# Patient Record
Sex: Female | Born: 1970 | Race: White | Hispanic: No | Marital: Married | Smoking: Current every day smoker
Health system: Southern US, Community
[De-identification: ages and names within clinical notes are randomized; demographics above are authoritative.]

## PROBLEM LIST (undated history)

## (undated) DIAGNOSIS — R079 Chest pain, unspecified: Secondary | ICD-10-CM

## (undated) HISTORY — PX: BLADDER SUSPENSION: SHX72

## (undated) HISTORY — DX: Chest pain, unspecified: R07.9

## (undated) HISTORY — PX: ABDOMINAL HYSTERECTOMY: SHX81

---

## 1999-01-23 ENCOUNTER — Inpatient Hospital Stay (HOSPITAL_COMMUNITY): Admission: AD | Admit: 1999-01-23 | Discharge: 1999-01-23 | Payer: Self-pay | Admitting: Obstetrics & Gynecology

## 1999-01-28 ENCOUNTER — Inpatient Hospital Stay (HOSPITAL_COMMUNITY): Admission: AD | Admit: 1999-01-28 | Discharge: 1999-01-31 | Payer: Self-pay | Admitting: Obstetrics & Gynecology

## 1999-01-28 ENCOUNTER — Encounter (INDEPENDENT_AMBULATORY_CARE_PROVIDER_SITE_OTHER): Payer: Self-pay | Admitting: Specialist

## 1999-01-28 ENCOUNTER — Inpatient Hospital Stay (HOSPITAL_COMMUNITY): Admission: AD | Admit: 1999-01-28 | Discharge: 1999-01-28 | Payer: Self-pay | Admitting: Obstetrics and Gynecology

## 2000-03-19 ENCOUNTER — Other Ambulatory Visit: Admission: RE | Admit: 2000-03-19 | Discharge: 2000-03-19 | Payer: Self-pay | Admitting: Obstetrics & Gynecology

## 2000-10-05 ENCOUNTER — Inpatient Hospital Stay (HOSPITAL_COMMUNITY): Admission: AD | Admit: 2000-10-05 | Discharge: 2000-10-05 | Payer: Self-pay | Admitting: Obstetrics and Gynecology

## 2000-10-23 ENCOUNTER — Encounter: Payer: Self-pay | Admitting: Obstetrics and Gynecology

## 2000-10-23 ENCOUNTER — Ambulatory Visit (HOSPITAL_COMMUNITY): Admission: RE | Admit: 2000-10-23 | Discharge: 2000-10-23 | Payer: Self-pay | Admitting: Obstetrics and Gynecology

## 2000-10-29 ENCOUNTER — Inpatient Hospital Stay (HOSPITAL_COMMUNITY): Admission: AD | Admit: 2000-10-29 | Discharge: 2000-11-02 | Payer: Self-pay | Admitting: Obstetrics & Gynecology

## 2001-04-06 ENCOUNTER — Other Ambulatory Visit: Admission: RE | Admit: 2001-04-06 | Discharge: 2001-04-06 | Payer: Self-pay | Admitting: Obstetrics & Gynecology

## 2002-04-13 ENCOUNTER — Other Ambulatory Visit: Admission: RE | Admit: 2002-04-13 | Discharge: 2002-04-13 | Payer: Self-pay | Admitting: Obstetrics & Gynecology

## 2003-04-18 ENCOUNTER — Other Ambulatory Visit: Admission: RE | Admit: 2003-04-18 | Discharge: 2003-04-18 | Payer: Self-pay | Admitting: Obstetrics & Gynecology

## 2004-05-15 ENCOUNTER — Emergency Department (HOSPITAL_COMMUNITY): Admission: EM | Admit: 2004-05-15 | Discharge: 2004-05-15 | Payer: Self-pay | Admitting: Family Medicine

## 2004-05-23 ENCOUNTER — Other Ambulatory Visit: Admission: RE | Admit: 2004-05-23 | Discharge: 2004-05-23 | Payer: Self-pay | Admitting: Obstetrics & Gynecology

## 2005-07-02 ENCOUNTER — Other Ambulatory Visit: Admission: RE | Admit: 2005-07-02 | Discharge: 2005-07-02 | Payer: Self-pay | Admitting: Obstetrics & Gynecology

## 2007-10-27 ENCOUNTER — Inpatient Hospital Stay (HOSPITAL_COMMUNITY): Admission: RE | Admit: 2007-10-27 | Discharge: 2007-10-29 | Payer: Self-pay | Admitting: Obstetrics & Gynecology

## 2007-10-27 ENCOUNTER — Encounter (INDEPENDENT_AMBULATORY_CARE_PROVIDER_SITE_OTHER): Payer: Self-pay | Admitting: Obstetrics and Gynecology

## 2009-01-15 ENCOUNTER — Encounter: Admission: RE | Admit: 2009-01-15 | Discharge: 2009-01-15 | Payer: Self-pay | Admitting: Obstetrics & Gynecology

## 2010-01-15 ENCOUNTER — Encounter: Admission: RE | Admit: 2010-01-15 | Discharge: 2010-01-15 | Payer: Self-pay | Admitting: Obstetrics & Gynecology

## 2010-10-22 NOTE — Op Note (Signed)
NAME:  Michelle Roberson, Michelle Roberson NO.:  [id number]   MEDICAL RECORD NO.:  [id number]          PATIENT TYPE:  INP   LOCATION:  9304                          FACILITY:  [facility]   PHYSICIAN:  Randye Lobo, M.D.   DATE OF BIRTH:  May 08, 1971   DATE OF PROCEDURE:  10/27/2007  DATE OF DISCHARGE:                               OPERATIVE REPORT   PREOPERATIVE DIAGNOSES:  1. Menorrhagia.  2. Genuine stress incontinence.  3. Incomplete uterovaginal prolapse.   POSTOPERATIVE DIAGNOSIS:  1. Menorrhagia.  2. Genuine stress incontinence.  3. Incomplete uterovaginal prolapse.   PROCEDURES:  Laparoscopically assisted vaginal hysterectomy, Monarch  transobturator sling, cystoscopy, and posterior colporrhaphy.   SURGEON:  Randye Lobo, MD   ASSISTANT:  Gerrit Friends. Aldona Bar, MD   ANESTHESIA:  General endotracheal, local with 0.5% lidocaine with  1:200,000 of epinephrine and 0.25% Marcaine.   IV FLUIDS:  3000 mL of Ringer's lactate.   ESTIMATED BLOOD LOSS:  250 mL   URINE OUTPUT:  Quantity sufficient.   COMPLICATIONS:  None.   INDICATIONS FOR PROCEDURE:  The patient is a 40 year old para 2  Caucasian female, who presented to her primary gynecologist, Dr. Annamaria Helling, with a report of urinary incontinence of several years' duration.  The patient was experiencing leakage of urine with stressful maneuvers  such as coughing and sneezing.  The patient also reported vaginal  splinting to have bowel movements.  She was also experiencing heavy  menses.  She declines future childbearing and wished to proceed with  surgical evaluation and treatment.  The patient, therefore, underwent  multichannel urodynamic testing in the office under my direction, and  this confirmed the presence of genuine stress incontinence with a leak  point pressure of 109 cm of water.  The patient declines medical therapy  for her menorrhagia.  On pelvic exam, the patient was noted to have a  first-degree cystocele, a  first-degree rectocele, and minimal uterine  prolapse.  A plan is now made to proceed with a laparoscopically  assisted vaginal hysterectomy, transobturator sling, cystoscopy, and  anterior and posterior colporrhaphy.  Risks, benefits, and alternatives  have been discussed, and the patient wishes to proceed.   FINDINGS:  Exam under anesthesia revealed a first-degree urethrocele.  There was a first-degree mid rectocele.  The uterus demonstrated minimal  prolapse.  The uterus was noted to be approximately 6- to 7-week size.  No adnexal masses were appreciated.   Laparoscopy demonstrated a normal uterus, tubes, and ovaries.  In the  right upper quadrant, there were adhesions between the omentum and the  anterior abdominal wall.  The liver and gallbladder appeared to be  unremarkable.  The stomach appeared to be dilated with fluid or gas.  There was no evidence of any adhesive disease in the pelvis, and there  was no evidence of any endometriosis.  Cystoscopy demonstrated a normal  bladder throughout 360 degrees including the bladder dome and trigone.  The ureters were noted to be patent bilaterally.  There was no evidence  of a foreign body in the bladder or the urethra.  SPECIMENS:  The uterus and cervix were sent to pathology.   PROCEDURE:  The patient was reidentified in the preoperative hold area.  She did receive Ancef 1 g IV for antibiotic prophylaxis.  The patient  received PAS stockings for DVT prophylaxis.   In the operating room, general endotracheal anesthesia was induced, and  the patient was then placed in the dorsal lithotomy position.  The  abdomen and vagina were then sterilely prepped and draped.  A Foley  catheter was placed inside the bladder.  A speculum was placed inside  the vagina and a single-tooth tenaculum was initially placed on the  anterior cervical lip.  This was replaced with a Hulka tenaculum, and  the remaining speculum was then removed.   Attention  was turned to the abdomen were 1-cm umbilical incision was  created sharply with a scalpel.  A 10-mm laparoscopic trocar was  inserted into the peritoneal cavity without difficulty.  The laparoscope  confirmed proper placement.  A pneumoperitoneum was achieved, and the  patient was then placed in the Trendelenburg position.  Two 5-mm  incisions were then created in the right and left lower quadrants, and 5-  mm trocars were placed under direct visualization of the laparoscope.  An inspection of the pelvic and abdominal organs was performed.  Please  refer to the findings above.   The Gyrus instrument was used to perform the laparoscopically assisted  vaginal hysterectomy.  The left fallopian tube was grasped and was  cauterized and cut with the Gyrus instrument.  The dissection was then  carried into the posterior leaf of the broad ligament using the same  instrument to cauterize and cut.  The left round ligament was then  cauterized with the Gyrus instrument and sharply divided with the same.  Finally, the left utero-ovarian ligament was grasped with the Gyrus  instrument, and again cauterized and cut.  The dissection was then  carried into the anterior leaf of the broad ligament, and the bladder  was sharply dissected off the lower uterine segment on the left-hand  side.  A branch of the uterine artery was then cauterized with a Gyrus  instrument and sharply divided.  This concluded the dissection on the  patient's left-hand side.  The same procedure that was performed on the  left-hand side was then repeated on the right-hand side using the Gyrus  instrument.  A very little of the bladder was dissected on the patient's  right hand-side, and the uterine artery was therefore not cauterized  laparoscopically on the right-hand side.  Hemostasis was noted to be  good at this time, and the procedure then was concluded vaginally.   The patient was then placed in the high lithotomy position.   The Hulka  tenaculum was taken off the anterior cervical lip and this was replaced  with a single-tooth tenaculum on the anterior cervical lip and a curved  Jacobs tenaculum on the posterior cervical lip.  The cervix was  circumscribed with a scalpel.  The posterior cul-de-sac was entered  sharply with a Mayo scissors, and digital exam confirmed proper entry  into this location.  A long weighted speculum was then placed in the  posterior cul-de-sac.  Each of the uterosacral ligaments was clamped,  sharply divided, and suture ligated with 0 Vicryl bilaterally.  There  was some bleeding of the peritoneum near the uterosacral ligament on the  patient's right-hand side, and this responded well to monopolar cautery.  The dissection then continued down  to the inferior aspects of the  cardinal ligaments, which were each clamped, sharply divided, and suture  ligated with 0 Vicryl suture.  The bladder was sharply dissected off the  lower uterine segment.  There was not very much descensus of the uterus,  and the anterior cul-de-sac could not be entered initially.  Again, the  remaining aspects of the cardinal ligaments were clamped bilaterally,  sharply divided, and suture ligated with 0 Vicryl.  At this time, the  anterior cul-de-sac could be entered sharply with the Metzenbaum  scissors.  Digital exam confirmed proper entry into this location.  One  final pedicle was clamped on the patient's left-hand side at the top of  the cardinal ligament.  This was sharply divided and suture ligated with  0 Vicryl.  The specimen was noted to be free on the patient's left-hand  side at this time.  The same was performed on the patient's right-hand  side, and the uterine specimen was then removed and sent to pathology.   The pedicles were reexamined.  There was some bleeding noted bilaterally  at the level of the uterosacral ligaments and just above and a series of  figure-of-eight sutures of 0 and 2-0 Vicryl  were therefore placed in  these regions to create good hemostasis.   The posterior vaginal cuff was sutured with a running locked suture of 0  Vicryl at this time.  A McCall culdoplasty suture was performed next  with 0 Vicryl suture.  The suture was brought through the vagina and  into the cul-de-sac at the 6 o'clock position, down through the distal  left uterosacral ligament, across the posterior cul-de-sac in a  pursestring fashion, down through the distal right uterosacral ligament  and then out through the cul-de-sac and into the vagina at the 6 o'clock  position.  The suture was held until the end of the case at which time  it was tied, and there was excellent support of the vaginal cuff.   The remaining pedicles were reexamined and hemostasis was noted to be  acceptable at this time.  The vaginal cuff was closed vertically with a  running locked suture of 0 Vicryl.   The anterior vaginal wall was noted to have really only a small  urethrocele with no evidence of a cystocele.  Allis clamps were used to  mark a 4-cm segment of the anterior vaginal wall in the midline.  The  mucosa was injected locally with 0.5% lidocaine with 1:200,000 of  epinephrine.  The vaginal mucosa was incised vertically with a scalpel.  The vaginal mucosa was dissected off the overlying bladder using a  combination of sharp and blunt dissection.  Hemostasis was created with  both monopolar cautery and figure-of-eight sutures of 2-0 Vicryl due to  some bleeding veins over the bladder.  This controlled the bleeding  adequately.   The dissection was carried back to the level of the pubic rami  bilaterally.  The transobturator sling was performed next.  The 1-cm  incisions were created in the crural folds below the level of the  adductor longus tendons and at the level just below the clitoris  bilaterally.  The transobturator needle passer was placed first through  the patient's left crural incision,  through the obturator membrane and  muscle, and into the endopelvic fascia on the ipsilateral side.  The  same procedure that was performed on the left-hand side was then  repeated on the right-hand side.   A Foley  catheter was removed at this time, and cystoscopy was performed,  and the findings were as noted above.  The Foley catheter was then  replaced to drain all the cystoscopy fluid and the sling was attached to  the needles and brought out through the thigh incisions.  A Kelly clamp  was placed between the sling and the urethra and the plastic sheaths  were removed.  The sling was noted to be in excellent position.  The  excess sling was trimmed at the level of the thigh incisions.   There was some bleeding noted from the vaginal exit site of the sling on  the patient's left-hand side.  This responded to 2 figure-of-eight  sutures of 2-0 Vicryl suture followed by compression and then Gelfoam.  Gelfoam was similarly placed in the exit site of the sling on the  patient's right-hand side in the vagina.  Excess vaginal mucosa was  trimmed, and then the anterior vaginal wall was closed with a running  lock suture of 2-0 Vicryl.   The posterior colporrhaphy was performed last.  Allis clamps were used  to mark the midline of the posterior vaginal mucosa.  The vaginal mucosa  was injected locally with 0.5% lidocaine with 1:200,000 of epinephrine.  A triangular wedge of epithelium was excised from the perineal body and  the vaginal mucosa was incised vertically in the midline with a  Metzenbaum scissors.  The perirectal fascia was dissected off the  vaginal mucosa bilaterally.  Hemostasis during the dissection was  created with both monopolar cautery and figure-of-eight sutures of 2-0  Vicryl.  At the top of the rectocele, a pursestring suture of 2-0 Vicryl  was placed.  The remainder of the rectocele repair was performed with  vertical mattress sutures of 0 Vicryl.  Excess vaginal  mucosa was  trimmed, and the posterior vaginal wall was then closed with a running  locked suture of 2-0 Vicryl, which was used to close the perineal body  in a subcuticular fashion as per an episiotomy repair.   The McCall culdoplasty suture was tied at this time.  There was good  hemostasis and excellent elevation and support of the vaginal cuff and  anterior posterior vaginal walls.   A rectal exam was performed, and there were no sutures in the rectum.  A  gauze packing with Estrace cream was placed in the vagina.  The crural  thigh incisions were then closed with sutures of 3-0 Vicryl.  Dermabond  was placed over these.   Final laparoscopy was performed after a CO2 pneumoperitoneum was  achieved.  The pelvis was irrigated and suctioned, and there was some  bleeding noted underneath the bladder region anterior to the vaginal  cuff, and this responded to bipolar cautery with the Gyrus instrument.   Hemostasis was excellent at this time, and the procedure was therefore  concluded.  The pneumoperitoneum was released after the 5-mm trocars  were removed.  The umbilical trocar and the laparoscope were removed  simultaneously.   The skin incisions were closed with subcuticular sutures of 3-0 or 4-0  Vicryl.  The skin was injected locally with 0.25% Marcaine plain.  Dermabond was placed over the incisions.   This concluded the patient's procedure.  There were no complications.  All needle, instrument, and sponge counts were correct.  The the patient  was escorted to recovery room in stable and awake condition.      Randye Lobo, M.D.  Electronically Signed  BES/MEDQ  D:  10/27/2007  T:  10/28/2007  Job:  [id number]

## 2010-10-25 NOTE — Discharge Summary (Signed)
NAME:  Michelle Roberson, Michelle Roberson NO.:  [id number]   MEDICAL RECORD NO.:  [id number]          PATIENT TYPE:  INP   LOCATION:  9304                          FACILITY:  [facility]   PHYSICIAN:  Michelle Roberson, M.D.   DATE OF BIRTH:  1971/02/15   DATE OF ADMISSION:  10/27/2007  DATE OF DISCHARGE:  10/29/2007                               DISCHARGE SUMMARY   ADMISSION DIAGNOSES:  1. Menorrhagia.  2. Genuine stress incontinence.  3. Incomplete uterovaginal prolapse.   DISCHARGE DIAGNOSES:  1. Menorrhagia.  2. Genuine stress incontinence.  3. Incomplete uterovaginal prolapse.   SIGNIFICANT OPERATIONS AND PROCEDURES:  The patient underwent a  laparoscopically-assisted vaginal hysterectomy with Monarc  transobturator sling, cystoscopy, and posterior colporrhaphy on Oct 27, 2007, at the [facility] of [place] under the direction of Dr.  Conley Simmonds and with the assistance of Dr. Annamaria Helling.   ADMISSION HISTORY AND PHYSICAL EXAMINATION:  The patient is a 37-year-  old para 2 Caucasian female referred by her primary gynecologist, Dr.  Annamaria Helling for urinary incontinence, heavy menses, and the need for  vaginal splinting to have bowel movements.  The patient completed her  childbearing and desired for surgical evaluation and treatment.   PAST MEDICAL HISTORY:  Unremarkable.   On physical exam, the patient was noted to have a first-degree  cystocele, first-degree rectocele, and minimal uterine prolapse.   Preoperative multichannel urodynamic testing confirmed the presence of  genuine stress incontinence with a leak point pressure of 109 cmH2O.   HOSPITAL COURSE:  The patient was admitted on Oct 27, 2007, at which  time she underwent a laparoscopically-assisted vaginal hysterectomy,  Monarc transobturator sling, cystoscopy, and posterior colporrhaphy  under the direction of Dr. Conley Simmonds with the assistance of Dr. Annamaria Helling.  The estimated blood loss from surgery was  250 mL, and there were  no complications.   The findings at surgery documented a first-degree urethrocele, a first-  degree mid rectocele, and the uterus with minimal prolapse.  Laparoscopy  demonstrated a normal uterus, tubes, and ovaries.  In the right upper  quadrant, there were adhesions between the omentum and the anterior  abdominal wall.  The liver and gallbladder were unremarkable.  The  stomach appeared to be dilated with either fluid or gas.  There was no  adhesive disease in the pelvis nor sign of endometriosis.  The  cystoscopy demonstrated a normal bladder without evidence of foreign  body, and the ureters were patent.   Postoperatively, the patient had a benign surgical recovery.  The  patient did require heavy doses of morphine from her PCA, and she was  treated with Toradol.  The patient was hemodynamically stable and  demonstrated no sign of an acute abdomen.  She had minimal vaginal  bleeding.  She did receive a vaginal packing and a Foley catheter  overnight, and she received had PAS stockings and TED hose for DVT  prophylaxis.   On postoperative day #1, the patient's hemoglobin measured 10.9.  Her  basic metabolic profile was unremarkable.  The patient had her vaginal  packing removed, and she had mild-to-moderate bloody drainage.  She was  making excellent urine.   The patient, on postoperative day #1, did report some left anterior  thigh numbness that had improved since the night before.  She was  ambulating well and she was doing yoga in her hospital room.   The patient's Foley catheter was removed on postop day #1, and she began  with her bladder trials.  These were successful with some minimal  residuals.   By postoperative day #2, the patient was ambulating well and tolerating  a regular diet, and she had good control of her pain with oral pain  medication.  She was found to be ready for discharge.   The patient's incisions were clean, dry, and intact  in both the  abdominal and the crural folds of the thighs.  Her final pathology  report is pending at the time of her discharge.   DISCHARGE INSTRUCTIONS:  1. Discharged to home.  2. The patient will take the following medications:      a.     Percocet 5 mg/325 mg one to two p.o. q.4-6 h. p.r.n. pain.      b.     Ibuprofen 600 mg p.o. q.6 h p.r.n. pain.      c.     Colace 100 mg p.o. daily to b.i.d. p.r.n. constipation.      d.     Ciprofloxacin 250 mg p.o. b.i.d. x5 days.   ACTIVITY:  The patient will have decreased activity at home.   DIET:  The patient will follow a regular diet.   FOLLOWUP:  The patient will follow up in the office in 10 days' time.  She will call sooner if she experiences any problems with fever, nausea,  and vomiting; pain uncontrolled by her medication; heavy vaginal  bleeding; difficulty voiding; or any other concern.      Michelle Roberson, M.D.  Electronically Signed     BES/MEDQ  D:  12/07/2007  T:  12/08/2007  Job:  [id number]

## 2011-03-05 LAB — CBC
HCT: 31.2 — ABNORMAL LOW
HCT: 39
Hemoglobin: 10.9 — ABNORMAL LOW
Hemoglobin: 13.4
MCHC: 34.5
MCHC: 34.8
MCV: 93.4
MCV: 94.1
Platelets: 176
Platelets: 200
RBC: 3.32 — ABNORMAL LOW
RBC: 4.18
RDW: 12.1
RDW: 12.3
WBC: 11.1 — ABNORMAL HIGH
WBC: 6.4

## 2011-03-05 LAB — BASIC METABOLIC PANEL
Calcium: 8.7
GFR calc Af Amer: >60
GFR calc non Af Amer: >60
Sodium: 134 — ABNORMAL LOW

## 2013-09-27 ENCOUNTER — Ambulatory Visit (INDEPENDENT_AMBULATORY_CARE_PROVIDER_SITE_OTHER): Payer: 59 | Admitting: Cardiology

## 2013-09-27 ENCOUNTER — Encounter: Payer: Self-pay | Admitting: *Deleted

## 2013-09-27 VITALS — BP 120/70 | HR 68 | Ht 70.0 in | Wt 188.0 lb

## 2013-09-27 DIAGNOSIS — Z720 Tobacco use: Secondary | ICD-10-CM | POA: Insufficient documentation

## 2013-09-27 DIAGNOSIS — F17200 Nicotine dependence, unspecified, uncomplicated: Secondary | ICD-10-CM

## 2013-09-27 DIAGNOSIS — R079 Chest pain, unspecified: Secondary | ICD-10-CM

## 2013-09-27 NOTE — Progress Notes (Signed)
     HPI: 43 yo female for evaluation of chest pain. No prior cardiac history. Patient typically does not have dyspnea on exertion, orthopnea, PND, pedal edema, palpitations, syncope or exertional chest pain. She is under a significant amount of stress related to an ongoing divorce. In the past 6 months she has had intermittent chest pain. The pain begins in the left breast area and radiates to the right. It is described as a sharp pain. No associated nausea, dyspnea or diaphoresis. It typically lasts approximately 10 minutes and resolves spontaneously. She notices this more with stress. It is not pleuritic, positional or exertional. She also describes bilateral lower extremity pain. This is in the posterior knee area and increases with standing on her feet all day. There is no claudication.  Current Outpatient Prescriptions  Medication Sig Dispense Refill  . ALPRAZolam (XANAX) 0.25 MG tablet Take 0.25 mg by mouth at bedtime as needed for anxiety.       No current facility-administered medications for this visit.    Not on File   Past Medical History  Diagnosis Date  . Chest pain     Past Surgical History  Procedure Laterality Date  . Abdominal hysterectomy    . Bladder suspension      History   Social History  . Marital Status: Married    Spouse Name: N/A    Number of Children: 2  . Years of Education: N/A   Occupational History  .      Employed at [employer]   Social History Main Topics  . Smoking status: Current Every Day Smoker  . Smokeless tobacco: Not on file  . Alcohol Use: Yes  . Drug Use: No  . Sexual Activity: Not on file   Other Topics Concern  . Not on file   Social History Narrative  . No narrative on file    Family History  Problem Relation Age of Onset  . Heart disease      No family history    ROS: Intermittent rash related to stress but no fevers or chills, productive cough, hemoptysis, dysphasia, odynophagia, melena, hematochezia, dysuria,  hematuria, seizure activity, orthopnea, PND, pedal edema, claudication. Remaining systems are negative.  Physical Exam:   Blood pressure 120/70, pulse 68, height 5\' 10"  (1.778 m), weight 188 lb (85.276 kg).  General:  Well developed/well nourished in NAD Skin warm/dry Patient not depressed No peripheral clubbing Back-normal HEENT-normal/normal eyelids Neck supple/normal carotid upstroke bilaterally; no bruits; no JVD; no thyromegaly chest - CTA/ normal expansion CV - RRR/normal S1 and S2; no murmurs, rubs or gallops;  PMI nondisplaced Abdomen -NT/ND, no HSM, no mass, + bowel sounds, no bruit 2+ femoral pulses, no bruits Ext-no edema, chords, 2+ DP Neuro-grossly nonfocal  ECG Sinus rhythm at a rate of 68. No ST changes.

## 2013-09-27 NOTE — Assessment & Plan Note (Signed)
Symptoms are atypical and potentially related to stress of ongoing divorce. Plan exercise treadmill for risk stratification. If negative no further ischemia evaluation.

## 2013-09-27 NOTE — Assessment & Plan Note (Signed)
Patient counseled on discontinuing. 

## 2013-09-27 NOTE — Patient Instructions (Signed)
Your physician recommends that you schedule a follow-up appointment in: AS NEEDED PENDING TEST RESULTS  Your physician has requested that you have an exercise tolerance test. For further information please visit [email or web address]. Please also follow instruction sheet, as given.    Exercise Stress Electrocardiogram An exercise stress electrocardiogram is a test to check how blood flows to your heart. It is done to find areas of poor blood flow. You will need to walk on a treadmill for this test. The electrocardiogram will record your heartbeat when you are at rest and when you are exercising. BEFORE THE PROCEDURE  Do not have drinks with caffeine or foods with caffeine for 24 hours before the test, or as told by your doctor.  Follow your doctor's instructions about eating and drinking before the test.  Ask your doctor what medicines you should or should not take before the test. Take your medicines with water unless told by your doctor not to.  If you use an inhaler, bring it with you to the test.  Bring a snack to eat after the test.  Do not  smoke for 4 hours before the test.  Wear comfortable shoes and clothing. PROCEDURE  You will have patches put on your chest. Small areas of your chest may need to be shaved. Wires will be connected to the patches.  Your heart rate will be watched while you are resting and while you are exercising.  You will walk on the treadmill. The treadmill will slowly get faster to raise your heart rate.  The test will take about 1 2 hours. AFTER THE PROCEDURE  Your heart rate and blood pressure will be watched after the test.  You may return to your normal diet, activities, and medicines or as told by your doctor. Document Released: 11/12/2007 Document Revised: 03/16/2013 Document Reviewed: 01/31/2013 [employer] Patient Information 2014 [employer].   

## 2013-10-11 ENCOUNTER — Telehealth (HOSPITAL_COMMUNITY): Payer: Self-pay

## 2013-10-13 ENCOUNTER — Ambulatory Visit (HOSPITAL_COMMUNITY)
Admission: RE | Admit: 2013-10-13 | Discharge: 2013-10-13 | Disposition: A | Discharge status: Home / Self Care | Payer: 59 | Source: Ambulatory Visit | Attending: Cardiology | Admitting: Cardiology

## 2013-10-13 DIAGNOSIS — R079 Chest pain, unspecified: Secondary | ICD-10-CM | POA: Insufficient documentation

## 2013-10-18 ENCOUNTER — Telehealth: Payer: Self-pay | Admitting: *Deleted

## 2013-10-18 NOTE — Telephone Encounter (Signed)
Message     GXT normal    Lewayne BuntingBrian S Crenshaw      Left message of results for pt

## 2013-12-22 NOTE — Telephone Encounter (Signed)
Encounter complete. 

## 2014-06-20 ENCOUNTER — Other Ambulatory Visit: Payer: Self-pay | Admitting: Obstetrics & Gynecology

## 2014-06-20 DIAGNOSIS — R928 Other abnormal and inconclusive findings on diagnostic imaging of breast: Secondary | ICD-10-CM

## 2014-06-22 ENCOUNTER — Ambulatory Visit
Admission: RE | Admit: 2014-06-22 | Discharge: 2014-06-22 | Disposition: A | Discharge status: Home / Self Care | Payer: 59 | Source: Ambulatory Visit | Attending: Obstetrics & Gynecology | Admitting: Obstetrics & Gynecology

## 2014-06-22 DIAGNOSIS — R928 Other abnormal and inconclusive findings on diagnostic imaging of breast: Secondary | ICD-10-CM

## 2014-06-23 ENCOUNTER — Other Ambulatory Visit: Payer: Self-pay

## 2015-07-30 ENCOUNTER — Other Ambulatory Visit: Payer: Self-pay

## 2015-07-30 DIAGNOSIS — Z1231 Encounter for screening mammogram for malignant neoplasm of breast: Secondary | ICD-10-CM

## 2015-08-09 ENCOUNTER — Ambulatory Visit: Payer: 59

## 2015-08-27 ENCOUNTER — Ambulatory Visit
Admission: RE | Admit: 2015-08-27 | Discharge: 2015-08-27 | Disposition: A | Discharge status: Home / Self Care | Payer: Commercial Managed Care - HMO | Source: Ambulatory Visit

## 2015-08-27 DIAGNOSIS — Z1231 Encounter for screening mammogram for malignant neoplasm of breast: Secondary | ICD-10-CM

## 2017-09-22 ENCOUNTER — Other Ambulatory Visit: Payer: Self-pay | Admitting: Obstetrics & Gynecology

## 2017-09-22 DIAGNOSIS — R928 Other abnormal and inconclusive findings on diagnostic imaging of breast: Secondary | ICD-10-CM

## 2017-11-23 ENCOUNTER — Ambulatory Visit
Admission: RE | Admit: 2017-11-23 | Discharge: 2017-11-23 | Disposition: A | Discharge status: Home / Self Care | Payer: 59 | Source: Ambulatory Visit | Attending: Obstetrics & Gynecology | Admitting: Obstetrics & Gynecology

## 2017-11-23 ENCOUNTER — Ambulatory Visit
Admission: RE | Admit: 2017-11-23 | Discharge: 2017-11-23 | Disposition: A | Discharge status: Home / Self Care | Payer: Commercial Managed Care - HMO | Source: Ambulatory Visit | Attending: Obstetrics & Gynecology | Admitting: Obstetrics & Gynecology

## 2017-11-23 ENCOUNTER — Other Ambulatory Visit: Payer: Self-pay | Admitting: Obstetrics & Gynecology

## 2017-11-23 DIAGNOSIS — R928 Other abnormal and inconclusive findings on diagnostic imaging of breast: Secondary | ICD-10-CM

## 2017-11-23 DIAGNOSIS — N6310 Unspecified lump in the right breast, unspecified quadrant: Secondary | ICD-10-CM

## 2017-12-21 ENCOUNTER — Ambulatory Visit
Admission: RE | Admit: 2017-12-21 | Discharge: 2017-12-21 | Disposition: A | Discharge status: Home / Self Care | Payer: 59 | Source: Ambulatory Visit | Attending: Obstetrics & Gynecology | Admitting: Obstetrics & Gynecology

## 2017-12-21 DIAGNOSIS — N6310 Unspecified lump in the right breast, unspecified quadrant: Secondary | ICD-10-CM

## 2019-09-29 ENCOUNTER — Other Ambulatory Visit: Payer: Self-pay | Admitting: Obstetrics & Gynecology

## 2019-09-29 DIAGNOSIS — R921 Mammographic calcification found on diagnostic imaging of breast: Secondary | ICD-10-CM

## 2019-11-14 ENCOUNTER — Other Ambulatory Visit: Payer: 59

## 2019-11-21 ENCOUNTER — Ambulatory Visit: Admission: RE | Admit: 2019-11-21 | Payer: 59 | Source: Ambulatory Visit

## 2019-11-21 ENCOUNTER — Ambulatory Visit
Admission: RE | Admit: 2019-11-21 | Discharge: 2019-11-21 | Disposition: A | Discharge status: Home / Self Care | Payer: 59 | Source: Ambulatory Visit | Attending: Obstetrics & Gynecology | Admitting: Obstetrics & Gynecology

## 2019-11-21 ENCOUNTER — Other Ambulatory Visit: Payer: Self-pay

## 2019-11-21 DIAGNOSIS — R921 Mammographic calcification found on diagnostic imaging of breast: Secondary | ICD-10-CM

## 2020-10-12 ENCOUNTER — Other Ambulatory Visit: Payer: Self-pay | Admitting: Obstetrics & Gynecology

## 2020-10-12 DIAGNOSIS — Z09 Encounter for follow-up examination after completed treatment for conditions other than malignant neoplasm: Secondary | ICD-10-CM

## 2020-11-28 ENCOUNTER — Other Ambulatory Visit: Payer: Self-pay | Admitting: Obstetrics & Gynecology

## 2020-11-28 DIAGNOSIS — N63 Unspecified lump in unspecified breast: Secondary | ICD-10-CM

## 2020-12-03 ENCOUNTER — Other Ambulatory Visit: Payer: Self-pay

## 2020-12-03 ENCOUNTER — Ambulatory Visit
Admission: RE | Admit: 2020-12-03 | Discharge: 2020-12-03 | Disposition: A | Discharge status: Home / Self Care | Payer: BLUE CROSS/BLUE SHIELD | Source: Ambulatory Visit | Attending: Obstetrics & Gynecology | Admitting: Obstetrics & Gynecology

## 2020-12-03 ENCOUNTER — Other Ambulatory Visit: Payer: Self-pay | Admitting: Obstetrics & Gynecology

## 2020-12-03 DIAGNOSIS — N63 Unspecified lump in unspecified breast: Secondary | ICD-10-CM

## 2022-03-02 IMAGING — MG DIGITAL DIAGNOSTIC BILAT W/ TOMO W/ CAD
6 of 10 series · 6 of 30 positions shown · non-contrast
Comparison: Previous exam(s).

CLINICAL DATA: The patient had possible distortion biopsied in the
right breast in Thursday December, 2017. Two year follow-up.

EXAM:
DIGITAL DIAGNOSTIC BILATERAL MAMMOGRAM WITH TOMOSYNTHESIS AND CAD;
ULTRASOUND RIGHT BREAST LIMITED
TECHNIQUE: Bilateral digital diagnostic mammography and breast tomosynthesis
was performed. The images were evaluated with computer-aided
detection.; Targeted ultrasound examination of the right breast was
performed

[L CC synth-2D]
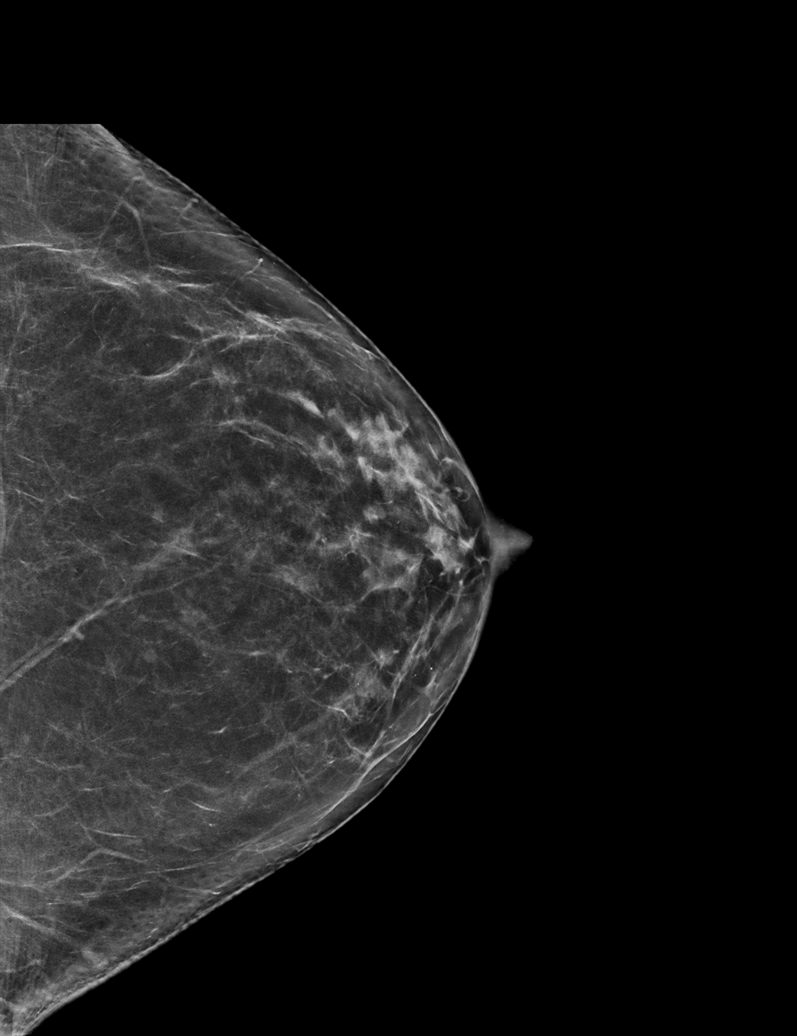

[L MLO synth-2D]
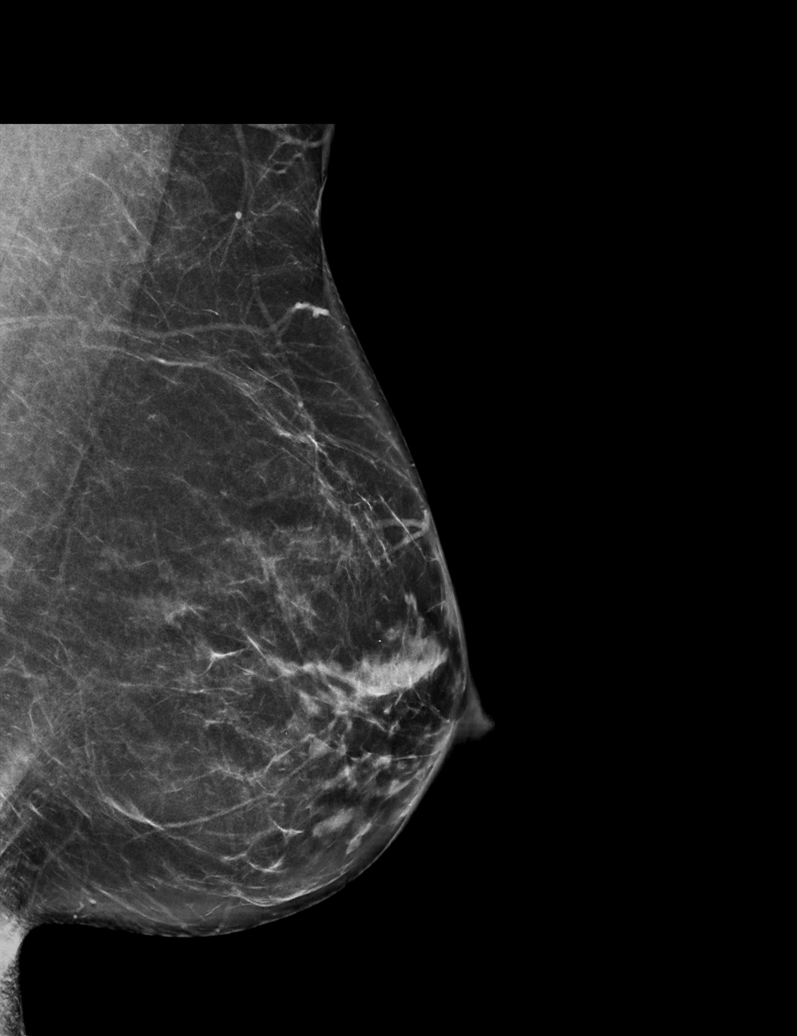

[R MLO synth-2D (1 of 2)]
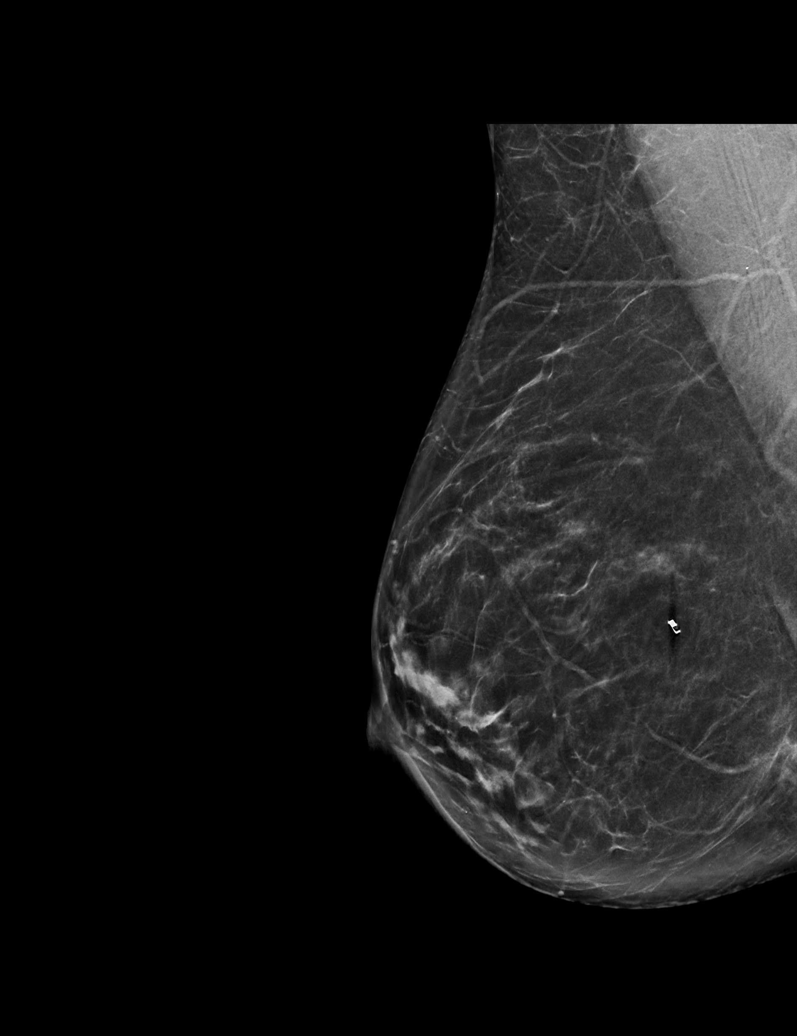

[R CC synth-2D]
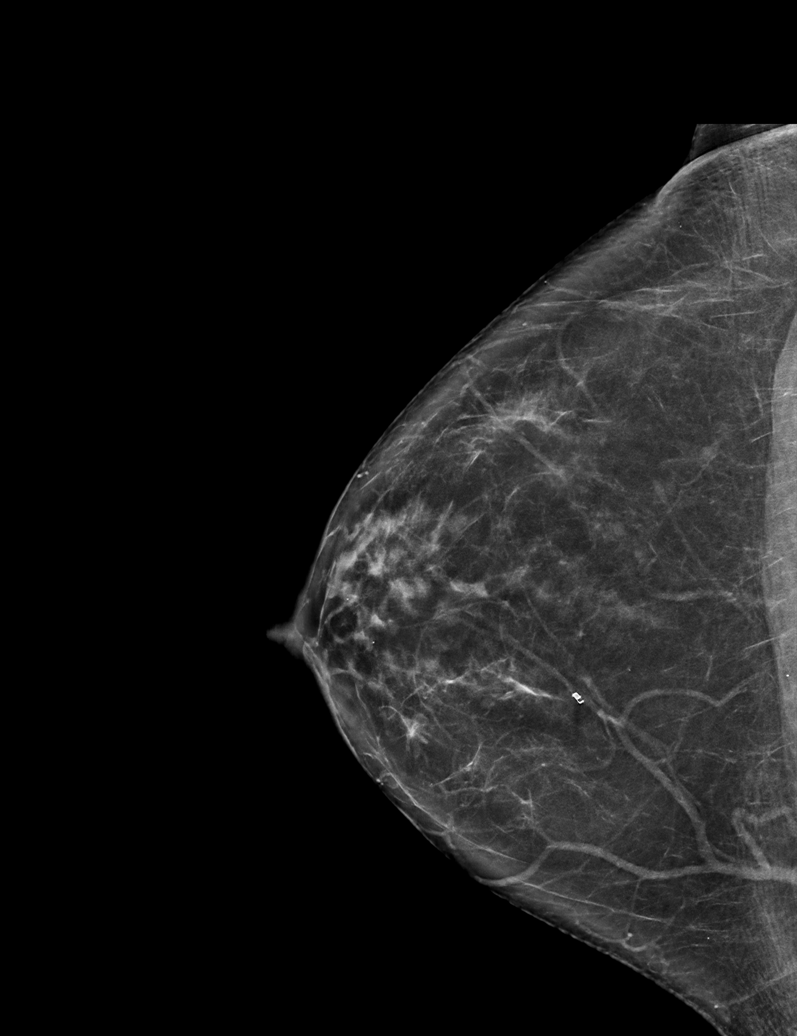

[R MLO synth-2D (2 of 2)]
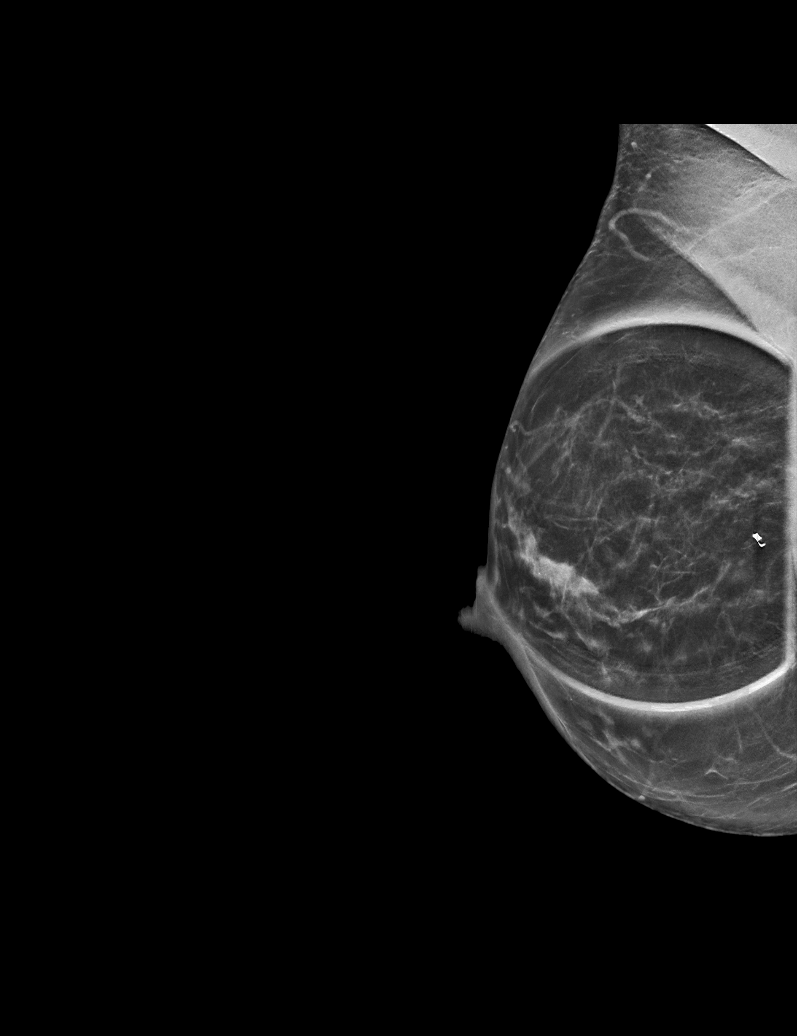

[L MLO tomo · tomo slice 36/71.0]
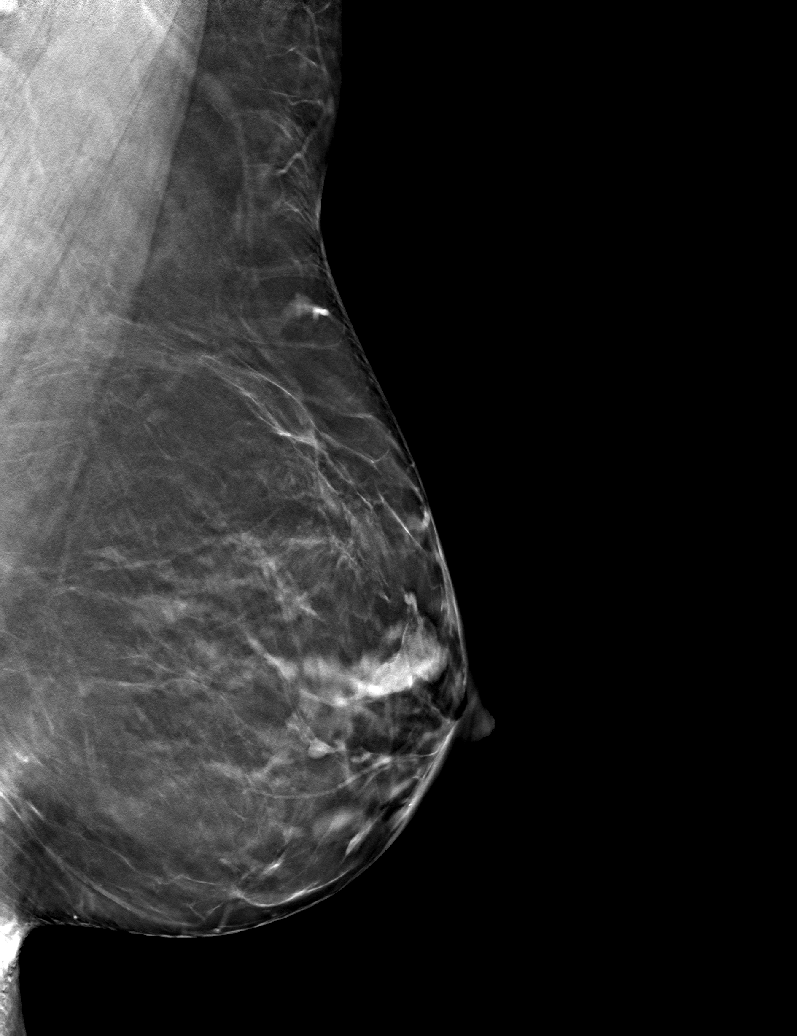

[6 of 30 positions shown; findings below may reference images not displayed]

ACR Breast Density Category b: There are scattered areas of
fibroglandular density.
FINDINGS: The possible distortion just anterior to the biopsy clip in the
medial right breast is stable. An asymmetry superior to the nipple
on the MLO view is stable. No other suspicious findings are
identified.

On physical exam, no suspicious lumps are identified.

Targeted ultrasound is performed, showing no abnormalities superior
to the nipple to correlate with the stable asymmetry.
IMPRESSION: No mammographic or sonographic evidence of malignancy.

RECOMMENDATION:
Annual screening mammography.

I have discussed the findings and recommendations with the patient.
If applicable, a reminder letter will be sent to the patient
regarding the next appointment.

BI-RADS CATEGORY  2: Benign.
